# Patient Record
Sex: Female | Born: 1980 | Race: White | Hispanic: No | Marital: Married | Smoking: Current every day smoker
Health system: Southern US, Community
[De-identification: ages and names within clinical notes are randomized; demographics above are authoritative.]

## PROBLEM LIST (undated history)

## (undated) HISTORY — PX: KNEE SURGERY: SHX244

---
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
## 2001-06-27 ENCOUNTER — Other Ambulatory Visit: Admission: RE | Admit: 2001-06-27 | Discharge: 2001-06-27 | Payer: Self-pay | Admitting: Obstetrics and Gynecology

## 2002-03-24 ENCOUNTER — Other Ambulatory Visit (HOSPITAL_COMMUNITY): Admission: RE | Admit: 2002-03-24 | Discharge: 2002-04-03 | Payer: Self-pay | Admitting: Psychiatry

## 2002-08-03 ENCOUNTER — Other Ambulatory Visit: Admission: RE | Admit: 2002-08-03 | Discharge: 2002-08-03 | Payer: Self-pay | Admitting: Obstetrics & Gynecology

## 2002-10-22 ENCOUNTER — Emergency Department (HOSPITAL_COMMUNITY): Admission: EM | Admit: 2002-10-22 | Discharge: 2002-10-22 | Payer: Self-pay | Admitting: *Deleted

## 2003-10-16 ENCOUNTER — Other Ambulatory Visit: Admission: RE | Admit: 2003-10-16 | Discharge: 2003-10-16 | Payer: Self-pay | Admitting: Obstetrics & Gynecology

## 2004-04-12 ENCOUNTER — Emergency Department (HOSPITAL_COMMUNITY): Admission: EM | Admit: 2004-04-12 | Discharge: 2004-04-13 | Payer: Self-pay | Admitting: Emergency Medicine

## 2013-05-25 ENCOUNTER — Encounter (HOSPITAL_COMMUNITY): Payer: Self-pay | Admitting: Emergency Medicine

## 2013-05-25 ENCOUNTER — Emergency Department (HOSPITAL_COMMUNITY)
Admission: EM | Admit: 2013-05-25 | Discharge: 2013-05-25 | Disposition: A | Discharge status: Home / Self Care | Payer: 59 | Source: Home / Self Care | Attending: Emergency Medicine | Admitting: Emergency Medicine

## 2013-05-25 DIAGNOSIS — L089 Local infection of the skin and subcutaneous tissue, unspecified: Secondary | ICD-10-CM

## 2013-05-25 DIAGNOSIS — L723 Sebaceous cyst: Secondary | ICD-10-CM

## 2013-05-25 MED ORDER — HYDROCODONE-ACETAMINOPHEN 5-325 MG PO TABS: ORAL_TABLET | ORAL | Status: DC

## 2013-05-25 MED ORDER — SULFAMETHOXAZOLE-TMP DS 800-160 MG PO TABS: 2.0000 | ORAL_TABLET | Freq: Two times a day (BID) | ORAL | Status: DC

## 2013-05-25 NOTE — ED Notes (Signed)
Written Rx's (Norco 5/325 and Septra DS)given to pt... Epic is down... Provider not able to document in system Scanned.

## 2013-05-25 NOTE — ED Notes (Signed)
Pt c/o abscess on sternum onset 1 week Site is tender and has a localized fever Denies drainage, fevers Alert w/no signs of acute distress.

## 2013-05-25 NOTE — ED Provider Notes (Signed)
Chief Complaint:   Chief Complaint  Patient presents with  . Abscess    History of Present Illness:    Dawn Savage is a 32 year old female who has a cyst on her mid, lower chest, as been present for about a week and over the past 2 days has become inflamed and painful. She denies any fever or drainage. No prior history of sebaceous cysts, abscesses, skin infections, or MRSA.  Review of Systems:  Other than noted above, the patient denies any of the following symptoms: Systemic:  No fever, chills or sweats. Skin:  No rash or itching.  PMFSH:  Past medical history, family history, social history, meds, and allergies were reviewed.  No history of diabetes or prior history of abscesses or MRSA.  Physical Exam:   Vital signs:  BP 118/75  Pulse 93  Temp(Src) 98.3 F (36.8 C) (Oral)  Resp 16  SpO2 95%  LMP 05/02/2013 Skin:  There is a raised, red, tender abscess in the mid lower chest measuring 2.5 cm in diameter. This was fluctuant, but there was no drainage.  Skin exam was otherwise normal.  No rash. Ext:  Distal pulses were full, patient has full ROM of all joints.  Procedure:  Verbal informed consent was obtained.  The patient was informed of the risks and benefits of the procedure and understands and accepts.  Identity of the patient was verified verbally and by wristband.   The abscess area described above was prepped with Betadine and alcohol and anesthetized with 3 mL of 2% Xylocaine with epinephrine.  Using a #11 scalpel blade, a singe straight incision was made into the area of fluctulence, yielding a large amount of prurulent drainage and squamous debris. The sebaceous cyst was completely evacuated of all squamous debris and cyst contents, and as much of the cyst wall as possible was removed, although I'm not sure it was entirely removed.  Routine cultures were obtained.  Blunt dissection was used to break up loculations and the resulting wound cavity was packed with 1/4 inch Iodoform  gauze.  A sterile pressure dressing was applied.  Assessment:  The encounter diagnosis was Infected sebaceous cyst of skin.  Plan:   1.  Meds:  The following meds were prescribed:   Discharge Medication List as of 05/25/2013  7:10 PM    START taking these medications   Details  HYDROcodone-acetaminophen (NORCO/VICODIN) 5-325 MG per tablet 1 to 2 tabs every 4 to 6 hours as needed for pain., Print    sulfamethoxazole-trimethoprim (BACTRIM DS) 800-160 MG per tablet Take 2 tablets by mouth 2 (two) times daily., Starting 05/25/2013, Until Discontinued, Normal        2.  Patient Education/Counseling:  The patient was given appropriate handouts, self care instructions, and instructed in symptomatic relief.  If the cyst recurs suggested she consult a surgeon to have it surgically excised.  3.  Follow up:  The patient was instructed to leave the dressing in place and return here again in 48 hours for packing removal, or sooner if becoming worse in any way, and given some red flag symptoms such as fever or which would prompt immediate return.      Reuben Likes, MD 05/25/13 2123

## 2013-05-27 ENCOUNTER — Encounter (HOSPITAL_COMMUNITY): Payer: Self-pay | Admitting: Emergency Medicine

## 2013-05-27 ENCOUNTER — Emergency Department (INDEPENDENT_AMBULATORY_CARE_PROVIDER_SITE_OTHER)
Admission: EM | Admit: 2013-05-27 | Discharge: 2013-05-27 | Disposition: A | Discharge status: Home / Self Care | Payer: 59 | Source: Home / Self Care | Attending: Family Medicine | Admitting: Family Medicine

## 2013-05-27 DIAGNOSIS — Z5189 Encounter for other specified aftercare: Secondary | ICD-10-CM

## 2013-05-27 DIAGNOSIS — Z4800 Encounter for change or removal of nonsurgical wound dressing: Secondary | ICD-10-CM

## 2013-05-27 NOTE — ED Provider Notes (Addendum)
CSN: [id number]     Arrival date & time 05/27/13  1310 History   None    Chief Complaint  Patient presents with  . Follow-up   (Consider location/radiation/quality/duration/timing/severity/associated sxs/prior Treatment) Patient is a 32 y.o. female presenting with wound check. The history is provided by the patient.  Wound Check This is a new problem. The current episode started 2 days ago. The problem has been gradually improving. Associated symptoms comments: Sl nausea from meds..    History reviewed. No pertinent past medical history. History reviewed. No pertinent past surgical history. No family history on file. History  Substance Use Topics  . Smoking status: Never Smoker   . Smokeless tobacco: Not on file  . Alcohol Use: Yes   OB History   Grav Para Term Preterm Abortions TAB SAB Ect Mult Living                 Review of Systems  Constitutional: Negative.   Gastrointestinal: Positive for nausea.  Skin: Positive for wound.    Allergies  Hydrocodone  Home Medications   Current Outpatient Rx  Name  Route  Sig  Dispense  Refill  . HYDROcodone-acetaminophen (NORCO/VICODIN) 5-325 MG per tablet      1 to 2 tabs every 4 to 6 hours as needed for pain.   20 tablet   0   . sulfamethoxazole-trimethoprim (BACTRIM DS) 800-160 MG per tablet   Oral   Take 2 tablets by mouth 2 (two) times daily.   40 tablet   0    BP 127/74  Pulse 82  Temp(Src) 98.1 F (36.7 C) (Oral)  Resp 16  SpO2 100%  LMP 05/02/2013 Physical Exam  Nursing note and vitals reviewed. Constitutional: She is oriented to person, place, and time. She appears well-developed and well-nourished. No distress.  Abdominal: Soft. Bowel sounds are normal.  Neurological: She is alert and oriented to person, place, and time.  Skin: Skin is warm and dry.  Packing removed, no infection or drainage.    ED Course  Procedures (including critical care time) Labs Review Labs Reviewed - No data to  display Imaging Review No results found.  EKG Interpretation    Date/Time:    Ventricular Rate:    PR Interval:    QRS Duration:   QT Interval:    QTC Calculation:   R Axis:     Text Interpretation:              MDM      Linna Hoff, MD 05/27/13 1427  Linna Hoff, MD 05/30/13 2032

## 2013-05-27 NOTE — ED Notes (Signed)
Pt is here for a f/u and to have packing removed Voices no new concerns.... Alert w/no signs of acute distress.

## 2013-05-29 LAB — CULTURE, ROUTINE-ABSCESS: Special Requests: NORMAL

## 2015-11-01 ENCOUNTER — Emergency Department (HOSPITAL_COMMUNITY)
Admission: EM | Admit: 2015-11-01 | Discharge: 2015-11-01 | Disposition: A | Discharge status: Home / Self Care | Payer: BLUE CROSS/BLUE SHIELD | Attending: Emergency Medicine | Admitting: Emergency Medicine

## 2015-11-01 ENCOUNTER — Encounter (HOSPITAL_COMMUNITY): Payer: Self-pay | Admitting: Emergency Medicine

## 2015-11-01 DIAGNOSIS — Z792 Long term (current) use of antibiotics: Secondary | ICD-10-CM | POA: Insufficient documentation

## 2015-11-01 DIAGNOSIS — M25531 Pain in right wrist: Secondary | ICD-10-CM | POA: Insufficient documentation

## 2015-11-01 DIAGNOSIS — R200 Anesthesia of skin: Secondary | ICD-10-CM | POA: Diagnosis not present

## 2015-11-01 MED ORDER — METHOCARBAMOL 500 MG PO TABS: 500.0000 mg | ORAL_TABLET | Freq: Two times a day (BID) | ORAL | Status: DC

## 2015-11-01 MED ORDER — IBUPROFEN 800 MG PO TABS: 800.0000 mg | ORAL_TABLET | Freq: Three times a day (TID) | ORAL | Status: DC

## 2015-11-01 NOTE — ED Provider Notes (Signed)
CSN: [id number]     Arrival date & time 11/01/15  2235 History  By signing my name below, I, [facility], attest that this documentation has been prepared under the direction and in the presence of Fayrene Helper, PA-C. Electronically Signed: Randell Patient, ED Scribe. 11/01/2015. 11:34 PM.   Chief Complaint  Patient presents with  . Wrist Pain   The history is provided by the patient. No language interpreter was used.   HPI Comments: Dawn Savage is a 35 y.o. female who presents to the Emergency Department complaining of constant, sharp, gradually improving right wrist pain onset earlier today when she was at rest. 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 She reports mild, partial numbness in her right hand in the third, fourth and fifth fingers and mild right hand swelling. Pain is worse with movement and unchanged with palpation. She has taken ibuprofen and applied a Deep Blue topical cream with slight relief of her pain. She is right hand dominant. Denies right shoulder pain, right elbow pain, fever.  History reviewed. No pertinent past medical history. Past Surgical History  Procedure Laterality Date  . Knee surgery     No family history on file. Social History  Substance Use Topics  . Smoking status: Never Smoker   . Smokeless tobacco: None  . Alcohol Use: Yes   OB History    No data available     Review of Systems  Constitutional: Negative for fever.  Musculoskeletal: Positive for joint swelling and arthralgias. Negative for myalgias.  Neurological: Positive for numbness.   Allergies  Hydrocodone  Home Medications   Prior to Admission medications   Medication Sig Start Date End Date Taking? Authorizing Provider  HYDROcodone-acetaminophen (NORCO/VICODIN) 5-325 MG per tablet 1 to 2 tabs every 4 to 6 hours as needed for pain. 05/25/13   Reuben Likes, MD  ibuprofen  (ADVIL,MOTRIN) 800 MG tablet Take 1 tablet (800 mg total) by mouth 3 (three) times daily. 11/01/15   Fayrene Helper, PA-C  methocarbamol (ROBAXIN) 500 MG tablet Take 1 tablet (500 mg total) by mouth 2 (two) times daily. 11/01/15   Fayrene Helper, PA-C  sulfamethoxazole-trimethoprim (BACTRIM DS) 800-160 MG per tablet Take 2 tablets by mouth 2 (two) times daily. 05/25/13   Reuben Likes, MD   BP 136/88 mmHg  Pulse 88  Temp(Src) 98 F (36.7 C) (Oral)  Resp 18  SpO2 96%  LMP 10/14/2015 (Approximate) Physical Exam  Constitutional: She is oriented to person, place, and time. She appears well-developed and well-nourished. No distress.  HENT:  Head: Normocephalic and atraumatic.  Eyes: Conjunctivae are normal.  Neck: Normal range of motion.  Cardiovascular: Normal rate.   Pulmonary/Chest: Effort normal. No respiratory distress.  Musculoskeletal: Normal range of motion. She exhibits tenderness. She exhibits no edema.  On right wrist: tend noted to ulnar aspects of right with palpation without crepitus and edema. Normal supination, pronation, flexion, and extension of right wrist. 2+ right radial. Normal grip strength. Brisk capillary refill to all fingers. Soft compartment of right arm. Right elbow non-tender with normal flexion and extension.  Neurological: She is alert and oriented to person, place, and time.  Skin: Skin is warm and dry.  Psychiatric: She has a normal mood and affect. Her behavior is normal.  Nursing note and vitals reviewed.   ED Course  Procedures   DIAGNOSTIC STUDIES: Oxygen Saturation is 96% on RA, adequate  by my interpretation.    COORDINATION OF CARE: 11:23 PM pain to ulnar aspect of R wrist, it's minimal.  Suspect tenosynovitis.  Pt has a brace at home.  No signs of infection. Doubt carpal tunnel syndrome.  Pt is NVI, with normal strength.  No compartment syndrome.  Discussed risks and benefits of ordering right wrist x-ray and pt and I agree to not order this imaging at  this time. Advised to continue at home treatment. Will provide pt with referral to hand specialist. Advised pt to follow-up as needed. Discussed treatment plan with pt at bedside and pt agreed to plan.     MDM   Final diagnoses:  Right wrist pain   BP 136/88 mmHg  Pulse 88  Temp(Src) 98 F (36.7 C) (Oral)  Resp 18  SpO2 96%  LMP 10/14/2015 (Approximate)   I personally performed the services described in this documentation, which was scribed in my presence. The recorded information has been reviewed and is accurate.      Fayrene HelperBowie Ronelle Michie, PA-C 11/01/15 2342  Layla MawKristen N Ward, DO 11/02/15 0030

## 2015-11-01 NOTE — Discharge Instructions (Signed)
Tendinitis Tendinitis is swelling and inflammation of the tendons. Tendons are band-like tissues that connect muscle to bone. Tendinitis commonly occurs in the:   Shoulders (rotator cuff).  Heels (Achilles tendon).  Elbows (triceps tendon).  Wrist CAUSES Tendinitis is usually caused by overusing the tendon, muscles, and joints involved. When the tissue surrounding a tendon (synovium) becomes inflamed, it is called tenosynovitis. Tendinitis commonly develops in people whose jobs require repetitive motions. SYMPTOMS  Pain.  Tenderness.  Mild swelling. DIAGNOSIS Tendinitis is usually diagnosed by physical exam. Your health care provider may also order X-rays or other imaging tests. TREATMENT Your health care provider may recommend certain medicines or exercises for your treatment. HOME CARE INSTRUCTIONS   Use a sling or splint for as long as directed by your health care provider until the pain decreases.  Put ice on the injured area.  Put ice in a plastic bag.  Place a towel between your skin and the bag.  Leave the ice on for 15-20 minutes, 3-4 times a day, or as directed by your health care provider.  Avoid using the limb while the tendon is painful. Perform gentle range of motion exercises only as directed by your health care provider. Stop exercises if pain or discomfort increase, unless directed otherwise by your health care provider.  Only take over-the-counter or prescription medicines for pain, discomfort, or fever as directed by your health care provider. SEEK MEDICAL CARE IF:   Your pain and swelling increase.  You develop new, unexplained symptoms, especially increased numbness in the hands. MAKE SURE YOU:   Understand these instructions.  Will watch your condition.  Will get help right away if you are not doing well or get worse.   This information is not intended to replace advice given to you by your health care provider. Make sure you discuss any  questions you have with your health care provider.   Document Released: 05/29/2000 Document Revised: 06/22/2014 Document Reviewed: 08/18/2010 Elsevier Interactive Patient Education [employer]2016 [employer] Inc.

## 2015-11-01 NOTE — ED Notes (Signed)
Patient able to ambulate independently  

## 2015-11-01 NOTE — ED Notes (Signed)
Pt. 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

## 2015-11-06 DIAGNOSIS — M24131 Other articular cartilage disorders, right wrist: Secondary | ICD-10-CM | POA: Diagnosis not present

## 2015-11-06 DIAGNOSIS — M778 Other enthesopathies, not elsewhere classified: Secondary | ICD-10-CM | POA: Diagnosis not present

## 2015-11-06 DIAGNOSIS — M25531 Pain in right wrist: Secondary | ICD-10-CM | POA: Diagnosis not present

## 2015-11-29 DIAGNOSIS — M24131 Other articular cartilage disorders, right wrist: Secondary | ICD-10-CM | POA: Diagnosis not present

## 2015-11-29 DIAGNOSIS — M25531 Pain in right wrist: Secondary | ICD-10-CM | POA: Diagnosis not present

## 2015-11-29 DIAGNOSIS — M778 Other enthesopathies, not elsewhere classified: Secondary | ICD-10-CM | POA: Diagnosis not present

## 2017-01-13 ENCOUNTER — Ambulatory Visit (HOSPITAL_COMMUNITY)
Admission: EM | Admit: 2017-01-13 | Discharge: 2017-01-13 | Disposition: A | Discharge status: Home / Self Care | Payer: BLUE CROSS/BLUE SHIELD | Attending: Family Medicine | Admitting: Family Medicine

## 2017-01-13 ENCOUNTER — Encounter (HOSPITAL_COMMUNITY): Payer: Self-pay | Admitting: Emergency Medicine

## 2017-01-13 DIAGNOSIS — H6982 Other specified disorders of Eustachian tube, left ear: Secondary | ICD-10-CM

## 2017-01-13 DIAGNOSIS — H6593 Unspecified nonsuppurative otitis media, bilateral: Secondary | ICD-10-CM | POA: Diagnosis not present

## 2017-01-13 MED ORDER — FLUTICASONE PROPIONATE 50 MCG/ACT NA SUSP: 2.0000 | Freq: Every day | NASAL | 0 refills | Status: DC

## 2017-01-13 MED ORDER — PREDNISONE 20 MG PO TABS: 40.0000 mg | ORAL_TABLET | Freq: Every day | ORAL | 0 refills | Status: AC

## 2017-01-13 NOTE — ED Triage Notes (Signed)
PT reports left ear pain that started [place].

## 2017-01-13 NOTE — Discharge Instructions (Signed)
No signs of bacterial infection today. The year pain could be due to fluid buildup behind the ears, as well as trouble draining fluid due to nasal congestion. Start prednisone and Flonase. Continue over-the-counter decongestions for nasal congestion. Monitor for worsening of symptoms, fever, increased warmth, increased redness, to follow-up for reevaluation.

## 2017-01-13 NOTE — ED Provider Notes (Signed)
CSN: [id number]     Arrival date & time 01/13/17  1642 History   None    Chief Complaint  Patient presents with  . Otalgia   (Consider location/radiation/quality/duration/timing/severity/associated sxs/prior Treatment) 36 year old female comes in with 5 day history of left ear pain. [place] the pain first started. Has a sensation that she needs to "pop her ear" but not able to. She's been taking over-the-counter decongestant without relief. Mild cough with nasal congestion, otherwise no fever, sore throat, trouble breathing, trouble swallowing. Denies abdominal pain, nausea, vomiting, diarrhea. Denies seasonal allergies.      History reviewed. No pertinent past medical history. Past Surgical History:  Procedure Laterality Date  . KNEE SURGERY     No family history on file. Social History  Substance Use Topics  . Smoking status: Current Every Day Smoker    Packs/day: 0.50    Types: Cigarettes  . Smokeless tobacco: Never Used  . Alcohol use No   OB History    No data available     Review of Systems  Reason unable to perform ROS: See HPI as above.    Allergies  Hydrocodone  Home Medications   Prior to Admission medications   Medication Sig Start Date End Date Taking? Authorizing Provider  fluticasone (FLONASE) 50 MCG/ACT nasal spray Place 2 sprays into both nostrils daily. 01/13/17   Cathie HoopsYu, Deepika Decatur V, PA-C  predniSONE (DELTASONE) 20 MG tablet Take 2 tablets (40 mg total) by mouth daily. 01/13/17 01/17/17  Belinda FisherYu, Jadore Veals V, PA-C   Meds Ordered and Administered this Visit  Medications - No data to display  BP 111/61   Pulse 87   Temp 98.9 F (37.2 C) (Oral)   Resp 16   Ht 5\' 3"  (1.6 m)   Wt 200 lb (90.7 kg)   LMP 12/23/2016   SpO2 98%   BMI 35.43 kg/m  No data found.   Physical Exam  Constitutional: She is oriented to person, place, and time. She appears well-developed and well-nourished. No distress.  HENT:  Head: Normocephalic and atraumatic.   Right Ear: External ear and ear canal normal. Tympanic membrane is not erythematous and not bulging. A middle ear effusion is present.  Left Ear: External ear and ear canal normal. Tympanic membrane is not erythematous and not bulging. A middle ear effusion is present.  Nose: Nose normal. Right sinus exhibits no maxillary sinus tenderness and no frontal sinus tenderness. Left sinus exhibits no maxillary sinus tenderness and no frontal sinus tenderness.  Mouth/Throat: Uvula is midline, oropharynx is clear and moist and mucous membranes are normal.  Tenderness on palpation around the post auricular area. No tenderness on palpation of the tragus. No swelling or erythema of the ear canal.  Eyes: Pupils are equal, round, and reactive to light. Conjunctivae are normal.  Neck: Normal range of motion. Neck supple.  Cardiovascular: Normal rate, regular rhythm and normal heart sounds.  Exam reveals no gallop and no friction rub.   No murmur heard. Pulmonary/Chest: Effort normal and breath sounds normal. She has no decreased breath sounds. She has no wheezes. She has no rhonchi. She has no rales.  Lymphadenopathy:    She has no cervical adenopathy.  Neurological: She is alert and oriented to person, place, and time.  Skin: Skin is warm and dry.  Psychiatric: She has a normal mood and affect. Her behavior is normal. Judgment normal.    Urgent Care Course     Procedures (including critical  care time)  Labs Review Labs Reviewed - No data to display  Imaging Review No results found.     MDM   1. Dysfunction of left eustachian tube   2. Fluid level behind tympanic membrane of both ears    Discussed with patient history and exam most consistent with mid ear effusion and eustachian tube dysfunction. Start prednisone and Flonase as directed. Continue over-the-counter decongestant. Discussed with patient tenderness to palpation behind the ears could be due to lymph nodes with the beginning symptoms  of a viral illness. Patient to monitor for worsening of symptoms, fever, increased pain, surrounding erythema and increased warmth, to follow-up for reevaluation.   Belinda FisherYu, Elisabet Gutzmer V, PA-C 01/13/17 1751

## 2017-03-25 DIAGNOSIS — S46002A Unspecified injury of muscle(s) and tendon(s) of the rotator cuff of left shoulder, initial encounter: Secondary | ICD-10-CM | POA: Diagnosis not present

## 2017-03-25 DIAGNOSIS — M7552 Bursitis of left shoulder: Secondary | ICD-10-CM | POA: Diagnosis not present

## 2017-10-26 DIAGNOSIS — L03039 Cellulitis of unspecified toe: Secondary | ICD-10-CM | POA: Diagnosis not present

## 2017-11-11 DIAGNOSIS — L03039 Cellulitis of unspecified toe: Secondary | ICD-10-CM | POA: Diagnosis not present

## 2017-11-16 DIAGNOSIS — M79644 Pain in right finger(s): Secondary | ICD-10-CM | POA: Diagnosis not present

## 2017-11-16 DIAGNOSIS — L03011 Cellulitis of right finger: Secondary | ICD-10-CM | POA: Diagnosis not present

## 2017-11-19 DIAGNOSIS — L03011 Cellulitis of right finger: Secondary | ICD-10-CM | POA: Diagnosis not present

## 2017-11-19 DIAGNOSIS — M79644 Pain in right finger(s): Secondary | ICD-10-CM | POA: Diagnosis not present

## 2018-03-04 DIAGNOSIS — L720 Epidermal cyst: Secondary | ICD-10-CM | POA: Diagnosis not present

## 2018-03-17 DIAGNOSIS — L720 Epidermal cyst: Secondary | ICD-10-CM | POA: Diagnosis not present

## 2018-06-17 ENCOUNTER — Ambulatory Visit
Admission: EM | Admit: 2018-06-17 | Discharge: 2018-06-17 | Disposition: A | Discharge status: Home / Self Care | Payer: BLUE CROSS/BLUE SHIELD | Attending: Emergency Medicine | Admitting: Emergency Medicine

## 2018-06-17 DIAGNOSIS — B9789 Other viral agents as the cause of diseases classified elsewhere: Secondary | ICD-10-CM | POA: Insufficient documentation

## 2018-06-17 DIAGNOSIS — J069 Acute upper respiratory infection, unspecified: Secondary | ICD-10-CM | POA: Insufficient documentation

## 2018-06-17 MED ORDER — FLUTICASONE PROPIONATE 50 MCG/ACT NA SUSP: 2.0000 | Freq: Every day | NASAL | 0 refills | Status: DC

## 2018-06-17 MED ORDER — BENZONATATE 100 MG PO CAPS: 100.0000 mg | ORAL_CAPSULE | Freq: Three times a day (TID) | ORAL | 0 refills | Status: DC

## 2018-06-17 MED ORDER — CETIRIZINE-PSEUDOEPHEDRINE ER 5-120 MG PO TB12: 1.0000 | ORAL_TABLET | Freq: Every day | ORAL | 0 refills | Status: DC

## 2018-06-17 NOTE — ED Triage Notes (Signed)
Pt c/o head and chest congestion with cough x3days

## 2018-06-17 NOTE — ED Provider Notes (Signed)
[facility]   [id number] 06/17/18 Arrival Time: 1621   CC: URI symptoms   SUBJECTIVE: History from: patient.  Dawn Savage is a 38 y.o. female who presents with abrupt onset of nasal congestion, runny nose, sore throat, and productive cough with brown/ green sputum x 3 days.  Admits to positive sick exposure to flu.  Has tried OTC medications without relief.  Symptoms are made worse with at night.  Reports previous symptoms in the past.  Complains of associated fatigue.   Denies fever, chills, SOB, wheezing, chest pain, nausea, changes in bowel or bladder habits.     Received flu shot this year: no.  ROS: As per HPI.  History reviewed. No pertinent past medical history. Past Surgical History:  Procedure Laterality Date  . KNEE SURGERY     Allergies  Allergen Reactions  . Hydrocodone    No current facility-administered medications on file prior to encounter.    No current outpatient medications on file prior to encounter.   Social History   Socioeconomic History  . Marital status: Married    Spouse name: Not on file  . Number of children: Not on file  . Years of education: Not on file  . Highest education level: Not on file  Occupational History  . Not on file  Social Needs  . Financial resource strain: Not on file  . Food insecurity:    Worry: Not on file    Inability: Not on file  . Transportation needs:    Medical: Not on file    Non-medical: Not on file  Tobacco Use  . Smoking status: Current Every Day Smoker    Packs/day: 0.50    Types: Cigarettes  . Smokeless tobacco: Never Used  Substance and Sexual Activity  . Alcohol use: No  . Drug use: No  . Sexual activity: Not on file  Lifestyle  . Physical activity:    Days per week: Not on file    Minutes per session: Not on file  . Stress: Not on file  Relationships  . Social connections:    Talks on phone: Not on file    Gets together: Not on file    Attends religious service: Not on file    Active member of club or organization: Not on file    Attends meetings of clubs or organizations: Not on file    Relationship status: Not on file  . Intimate partner violence:    Fear of current or ex partner: Not on file    Emotionally abused: Not on file    Physically abused: Not on file    Forced sexual activity: Not on file  Other Topics Concern  . Not on file  Social History Narrative  . Not on file   No family history on file.  OBJECTIVE:  Vitals:   06/17/18 1630  BP: 117/81  Pulse: 96  Resp: 18  Temp: 98 F (36.7 C)  TempSrc: Oral  SpO2: 96%     General appearance: alert; appears mildly fatigued, but nontoxic; speaking in full sentences and tolerating own secretions HEENT: NCAT; Ears: EACs clear, TMs pearly gray; Eyes: PERRL.  EOM grossly intact. Nose: nares patent with mild rhinorrhea, turbinates swollen and erythematous Throat: oropharynx clear, tonsils non erythematous or enlarged, uvula midline  Neck: supple without LAD Lungs: unlabored respirations, symmetrical air entry; cough: mild; no respiratory distress; CTAB Heart: regular rate and rhythm.  Radial pulses 2+ symmetrical bilaterally Skin: warm and dry Psychological: alert and cooperative;  normal mood and affect  ASSESSMENT & PLAN:  1. Viral URI with cough     Meds ordered this encounter  Medications  . fluticasone (FLONASE) 50 MCG/ACT nasal spray    Sig: Place 2 sprays into both nostrils daily.    Dispense:  16 g    Refill:  0    Order Specific Question:   Supervising Provider    Answer:   Eustace Moore [[id number]]  . cetirizine-pseudoephedrine (ZYRTEC-D) 5-120 MG tablet    Sig: Take 1 tablet by mouth daily.    Dispense:  30 tablet    Refill:  0    Order Specific Question:   Supervising Provider    Answer:   Eustace Moore [[id number]]  . benzonatate (TESSALON) 100 MG capsule    Sig: Take 1 capsule (100 mg total) by mouth every 8 (eight) hours.    Dispense:  21 capsule    Refill:  0     Order Specific Question:   Supervising Provider    Answer:   Eustace Moore [[id number]]    Get plenty of rest and push fluids Tessalon Perles prescribed for cough Zyrtec-D prescribed for nasal congestion, runny nose, and/or sore throat Flonase prescribed for nasal congestion and runny nose Use medications daily for symptom relief Use OTC medications like ibuprofen or tylenol as needed fever or pain Follow up with PCP if symptoms persist Return or go to ER if you have any new or worsening symptoms fever, chills, nausea, vomiting, chest pain, cough, shortness of breath, wheezing, abdominal pain, changes in bowel or bladder habits, etc...  Reviewed expectations re: course of current medical issues. Questions answered. Outlined signs and symptoms indicating need for more acute intervention. Patient verbalized understanding. After Visit Summary given.         Rennis Harding, PA-C 06/17/18 1649

## 2018-06-17 NOTE — Discharge Instructions (Addendum)
Get plenty of rest and push fluids °Tessalon Perles prescribed for cough °Zyrtec-D prescribed for nasal congestion, runny nose, and/or sore throat °Flonase prescribed for nasal congestion and runny nose °Use medications daily for symptom relief °Use OTC medications like ibuprofen or tylenol as needed fever or pain °Follow up with PCP if symptoms persist °Return or go to ER if you have any new or worsening symptoms fever, chills, nausea, vomiting, chest pain, cough, shortness of breath, wheezing, abdominal pain, changes in bowel or bladder habits, etc... °

## 2018-10-24 ENCOUNTER — Ambulatory Visit (INDEPENDENT_AMBULATORY_CARE_PROVIDER_SITE_OTHER): Payer: BLUE CROSS/BLUE SHIELD

## 2018-10-24 ENCOUNTER — Other Ambulatory Visit: Payer: Self-pay

## 2018-10-24 ENCOUNTER — Encounter: Payer: Self-pay | Admitting: Emergency Medicine

## 2018-10-24 ENCOUNTER — Ambulatory Visit
Admission: EM | Admit: 2018-10-24 | Discharge: 2018-10-24 | Disposition: A | Discharge status: Home / Self Care | Payer: BLUE CROSS/BLUE SHIELD | Attending: Physician Assistant | Admitting: Physician Assistant

## 2018-10-24 DIAGNOSIS — S6991XA Unspecified injury of right wrist, hand and finger(s), initial encounter: Secondary | ICD-10-CM | POA: Diagnosis not present

## 2018-10-24 DIAGNOSIS — W208XXA Other cause of strike by thrown, projected or falling object, initial encounter: Secondary | ICD-10-CM | POA: Diagnosis not present

## 2018-10-24 DIAGNOSIS — M79641 Pain in right hand: Secondary | ICD-10-CM | POA: Diagnosis not present

## 2018-10-24 DIAGNOSIS — S6721XA Crushing injury of right hand, initial encounter: Secondary | ICD-10-CM

## 2018-10-24 NOTE — Discharge Instructions (Signed)
X-ray negative for fracture or dislocation.  Continue antibiotic ointment for the next 2 to 3 days to prevent infection.  Use soap and water gently to clean the wounds, keep clean and dry.  Do not soak in water.  Ice compress to prevent worsening swelling.  Finger splint during activity.  Monitor for spreading redness, increased warmth, fever, follow-up for reevaluation.  If still having trouble moving fingers despite pain resolving, numbness/tingling, follow-up with hand orthopedics for further evaluation and management needed.

## 2018-10-24 NOTE — ED Triage Notes (Signed)
Pt presents to [facility] for assessment of right hand pain after sustaining an injury (patient not really sure how it happened) to her right hand.  Patient applied anitbiotic ointment after cleaning the area PTA.  Pt unsure of last tetanus.

## 2018-10-24 NOTE — ED Notes (Signed)
Patient able to ambulate independently  

## 2018-10-24 NOTE — ED Provider Notes (Signed)
[facility]    CSN: [id number] Arrival date & time: 10/24/18  1342     History   Chief Complaint Chief Complaint  Patient presents with  . Hand Pain    HPI Dawn Savage is a 38 y.o. female.   38 year old female right hand pain and injury that occurred a few hours prior to arrival. States was pulling a log when it rolled onto her right hand.  She has abrasions along the dorsal aspect of the hand with swelling and contusion to the second and third finger.  Denies numbness, tingling.  Mild decrease in range of motion.  Cleaned area with water and hydrogen peroxide, applied antibiotic ointment, and came in for evaluation. Unknown last tetanus.      History reviewed. No pertinent past medical history.  There are no active problems to display for this patient.   Past Surgical History:  Procedure Laterality Date  . KNEE SURGERY      OB History   No obstetric history on file.      Home Medications    Prior to Admission medications   Medication Sig Start Date End Date Taking? Authorizing Provider  cetirizine-pseudoephedrine (ZYRTEC-D) 5-120 MG tablet Take 1 tablet by mouth daily. 06/17/18   Wurst, [place], PA-C  fluticasone (FLONASE) 50 MCG/ACT nasal spray Place 2 sprays into both nostrils daily. 06/17/18   Rennis HardingWurst, Brittany, PA-C    Family History History reviewed. No pertinent family history.  Social History Social History   Tobacco Use  . Smoking status: Current Every Day Smoker    Packs/day: 0.50    Types: Cigarettes  . Smokeless tobacco: Never Used  Substance Use Topics  . Alcohol use: No  . Drug use: No     Allergies   Hydrocodone   Review of Systems Review of Systems  Reason unable to perform ROS: See HPI as above.     Physical Exam Triage Vital Signs ED Triage Vitals [10/24/18 1354]  Enc Vitals Group     BP 127/85     Pulse Rate 86     Resp 16     Temp 98.3 F (36.8 C)     Temp Source Oral     SpO2 95 %     Weight    Height      Head Circumference      Peak Flow      Pain Score 5     Pain Loc      Pain Edu?      Excl. in GC?    No data found.  Updated Vital Signs BP 127/85 (BP Location: Left Arm)   Pulse 86   Temp 98.3 F (36.8 C) (Oral)   Resp 16   SpO2 95%   Physical Exam Constitutional:      General: She is not in acute distress.    Appearance: She is well-developed. She is not diaphoretic.  HENT:     Head: Normocephalic and atraumatic.  Eyes:     Conjunctiva/sclera: Conjunctivae normal.     Pupils: Pupils are equal, round, and reactive to light.  Musculoskeletal:     Comments: Multiple abrasions along dorsal right hand. Bleeding controlled. Swelling with contusion to the PIP of 2nd and 3rd finger. Tenderness to palpation along 2nd and 3rd MCP/finger. Decreased flexion of 2nd and 3rd digits. Full ROM of wrist. Strength deferred. Sensation intact and equal bilaterally. Radial pulse 2+, cap refill <2s  Neurological:     Mental Status: She is  alert and oriented to person, place, and time.     UC Treatments / Results  Labs (all labs ordered are listed, but only abnormal results are displayed) Labs Reviewed - No data to display  EKG None  Radiology Dg Hand Complete Right  Result Date: 10/24/2018 CLINICAL DATA:  Lifting injury with pain. EXAM: RIGHT HAND - COMPLETE 3+ VIEW COMPARISON:  None. FINDINGS: There is no evidence of fracture or dislocation. There is no evidence of arthropathy or other focal bone abnormality. Soft tissues are unremarkable. IMPRESSION: Negative. Electronically Signed   By: Paulina Fusi M.D.   On: 10/24/2018 14:15    Procedures Procedures (including critical care time)  Medications Ordered in UC Medications - No data to display  Initial Impression / Assessment and Plan / UC Course  I have reviewed the triage vital signs and the nursing notes.  Pertinent labs & imaging results that were available during my care of the patient were reviewed by me and  considered in my medical decision making (see chart for details).    X-ray negative for fracture or dislocation.  Rest compress, elevation, finger splint during activity.  Discussed course of recovery.  Return precautions given.  Patient expresses understanding and agrees to plan.  Final Clinical Impressions(s) / UC Diagnoses   Final diagnoses:  Crushing injury of right hand, initial encounter    ED Prescriptions    None        Belinda Fisher, PA-C 10/24/18 1713

## 2018-11-01 DIAGNOSIS — M25531 Pain in right wrist: Secondary | ICD-10-CM | POA: Diagnosis not present

## 2018-11-10 DIAGNOSIS — M79644 Pain in right finger(s): Secondary | ICD-10-CM | POA: Diagnosis not present

## 2018-11-17 DIAGNOSIS — M79644 Pain in right finger(s): Secondary | ICD-10-CM | POA: Diagnosis not present

## 2018-11-17 DIAGNOSIS — M67831 Other specified disorders of synovium, right wrist: Secondary | ICD-10-CM | POA: Diagnosis not present

## 2018-12-20 DIAGNOSIS — M67831 Other specified disorders of synovium, right wrist: Secondary | ICD-10-CM | POA: Diagnosis not present

## 2019-01-17 DIAGNOSIS — M67831 Other specified disorders of synovium, right wrist: Secondary | ICD-10-CM | POA: Diagnosis not present

## 2020-02-15 ENCOUNTER — Ambulatory Visit
Admission: EM | Admit: 2020-02-15 | Discharge: 2020-02-15 | Disposition: A | Discharge status: Home / Self Care | Payer: BC Managed Care – PPO | Attending: Emergency Medicine | Admitting: Emergency Medicine

## 2020-02-15 ENCOUNTER — Other Ambulatory Visit: Payer: Self-pay

## 2020-02-15 DIAGNOSIS — N3001 Acute cystitis with hematuria: Secondary | ICD-10-CM

## 2020-02-15 LAB — POCT URINALYSIS DIP (MANUAL ENTRY)
Bilirubin, UA: NEGATIVE
Glucose, UA: NEGATIVE mg/dL
Ketones, POC UA: NEGATIVE mg/dL
Nitrite, UA: POSITIVE — AB
Protein Ur, POC: 30 mg/dL — AB
Spec Grav, UA: 1.02 (ref 1.010–1.025)
Urobilinogen, UA: 0.2 E.U./dL
pH, UA: 6 (ref 5.0–8.0)

## 2020-02-15 LAB — POCT URINE PREGNANCY: Preg Test, Ur: NEGATIVE

## 2020-02-15 MED ORDER — NITROFURANTOIN MONOHYD MACRO 100 MG PO CAPS: 100.0000 mg | ORAL_CAPSULE | Freq: Two times a day (BID) | ORAL | 0 refills | Status: DC

## 2020-02-15 NOTE — ED Provider Notes (Signed)
[facility]    CSN: [id number] Arrival date & time: 02/15/20  0853      History   Chief Complaint Chief Complaint  Patient presents with  . Urinary Tract Infection    HPI Dawn Savage is a 39 y.o. female  Subjective:  Dawn Savage is a 39 y.o. female who complains of dysuria, frequency and urgency for a few days.  Patient denies back pain, fever, stomach ache and vaginal discharge.  Patient does not have a history of recurrent UTI.  Patient does not have a history of pyelonephritis. The following portions of the patient's history were reviewed and updated as appropriate: allergies, current medications, past family history, past medical history, past social history, past surgical history and problem list.     History reviewed. No pertinent past medical history.  There are no problems to display for this patient.   Past Surgical History:  Procedure Laterality Date  . KNEE SURGERY      OB History   No obstetric history on file.      Home Medications    Prior to Admission medications   Medication Sig Start Date End Date Taking? Authorizing Provider  nitrofurantoin, macrocrystal-monohydrate, (MACROBID) 100 MG capsule Take 1 capsule (100 mg total) by mouth 2 (two) times daily. 02/15/20   Hall-Potvin, [place], PA-C    Family History History reviewed. No pertinent family history.  Social History Social History   Tobacco Use  . Smoking status: Current Every Day Smoker    Packs/day: 0.50    Types: Cigarettes  . Smokeless tobacco: Never Used  Substance Use Topics  . Alcohol use: No  . Drug use: No     Allergies   Hydrocodone   Review of Systems As per HPI   Physical Exam Triage Vital Signs ED Triage Vitals [02/15/20 1000]  Enc Vitals Group     BP 121/75     Pulse Rate 63     Resp 18     Temp 98.5 F (36.9 C)     Temp Source Oral     SpO2 96 %     Weight      Height      Head Circumference      Peak Flow      Pain Score 5      Pain Loc      Pain Edu?      Excl. in GC?    No data found.  Updated Vital Signs BP 121/75 (BP Location: Left Arm)   Pulse 63   Temp 98.5 F (36.9 C) (Oral)   Resp 18   LMP 02/04/2020   SpO2 96%   Visual Acuity Right Eye Distance:   Left Eye Distance:   Bilateral Distance:    Right Eye Near:   Left Eye Near:    Bilateral Near:     Physical Exam Constitutional:      General: She is not in acute distress. HENT:     Head: Normocephalic and atraumatic.  Eyes:     General: No scleral icterus.    Pupils: Pupils are equal, round, and reactive to light.  Cardiovascular:     Rate and Rhythm: Normal rate.  Pulmonary:     Effort: Pulmonary effort is normal.  Abdominal:     General: Bowel sounds are normal.     Palpations: Abdomen is soft.     Tenderness: There is no abdominal tenderness. There is no right CVA tenderness, left CVA tenderness or guarding.  Genitourinary:  Comments: Patient declined, self-swab performed Skin:    Coloration: Skin is not jaundiced or pale.  Neurological:     Mental Status: She is alert and oriented to person, place, and time.      UC Treatments / Results  Labs (all labs ordered are listed, but only abnormal results are displayed) Labs Reviewed  POCT URINALYSIS DIP (MANUAL ENTRY) - Abnormal; Notable for the following components:      Result Value   Clarity, UA cloudy (*)    Blood, UA large (*)    Protein Ur, POC =30 (*)    Nitrite, UA Positive (*)    Leukocytes, UA Large (3+) (*)    All other components within normal limits  URINE CULTURE  POCT URINE PREGNANCY    EKG   Radiology No results found.  Procedures Procedures (including critical care time)  Medications Ordered in UC Medications - No data to display  Initial Impression / Assessment and Plan / UC Course  I have reviewed the triage vital signs and the nursing notes.  Pertinent labs & imaging results that were available during my care of the patient were  reviewed by me and considered in my medical decision making (see chart for details).     Plan:  1. Medications: nitrofurantoin 2. Maintain adequate hydration 3. Follow up if symptoms not improving, and prn.  Final Clinical Impressions(s) / UC Diagnoses   Final diagnoses:  Acute cystitis with hematuria     Discharge Instructions     Take antibiotic twice daily with food. Important to drink plenty of water throughout the day. May take Azo as needed for burning sensation. Return for worsening urinary symptoms, blood in urine, abdominal or back pain, fever.    ED Prescriptions    Medication Sig Dispense Auth. Provider   nitrofurantoin, macrocrystal-monohydrate, (MACROBID) 100 MG capsule Take 1 capsule (100 mg total) by mouth 2 (two) times daily. 10 capsule Hall-Potvin, [place], PA-C     PDMP not reviewed this encounter.   Hall-Potvin, [place], [place] 02/15/20 1046

## 2020-02-15 NOTE — ED Triage Notes (Signed)
Pt c/o urinary discomfort, urgency, and frequency since Sunday night.

## 2020-02-15 NOTE — Discharge Instructions (Signed)
Take antibiotic twice daily with food. °Important to drink plenty of water throughout the day. °May take Azo as needed for burning sensation. °Return for worsening urinary symptoms, blood in urine, abdominal or back pain, fever. °

## 2020-02-17 LAB — URINE CULTURE: Culture: >=100000 — AB

## 2020-03-09 ENCOUNTER — Encounter: Payer: Self-pay | Admitting: Emergency Medicine

## 2020-03-09 ENCOUNTER — Ambulatory Visit
Admission: EM | Admit: 2020-03-09 | Discharge: 2020-03-09 | Disposition: A | Discharge status: Home / Self Care | Payer: BC Managed Care – PPO | Attending: Family Medicine | Admitting: Family Medicine

## 2020-03-09 ENCOUNTER — Other Ambulatory Visit: Payer: Self-pay

## 2020-03-09 DIAGNOSIS — J069 Acute upper respiratory infection, unspecified: Secondary | ICD-10-CM

## 2020-03-09 DIAGNOSIS — H66002 Acute suppurative otitis media without spontaneous rupture of ear drum, left ear: Secondary | ICD-10-CM

## 2020-03-09 MED ORDER — PREDNISONE 20 MG PO TABS: 20.0000 mg | ORAL_TABLET | Freq: Every day | ORAL | 0 refills | Status: AC

## 2020-03-09 MED ORDER — CEPHALEXIN 500 MG PO CAPS: 500.0000 mg | ORAL_CAPSULE | Freq: Three times a day (TID) | ORAL | 0 refills | Status: AC

## 2020-03-09 NOTE — ED Provider Notes (Signed)
[facility]    CSN: [id number] Arrival date & time: 03/09/20  1224      History   Chief Complaint Chief Complaint  Patient presents with  . Otalgia    HPI Dawn Savage is a 39 y.o. female.   HPI  Otalgia in both ears x 3 days. Endorses some mild congestion. No fever. No recent swimming. No fever. Endorses recurrent sinus infection. Congestion and facial pressure present for over week. She has tried OTC medication for congestion without relief. History reviewed. No pertinent past medical history.  There are no problems to display for this patient.   Past Surgical History:  Procedure Laterality Date  . KNEE SURGERY      OB History   No obstetric history on file.      Home Medications    Prior to Admission medications   Medication Sig Start Date End Date Taking? Authorizing Provider  cephALEXin (KEFLEX) 500 MG capsule Take 1 capsule (500 mg total) by mouth 3 (three) times daily for 10 days. 03/09/20 03/19/20  Bing Neighbors, FNP  nitrofurantoin, macrocrystal-monohydrate, (MACROBID) 100 MG capsule Take 1 capsule (100 mg total) by mouth 2 (two) times daily. Patient not taking: Reported on 03/09/2020 02/15/20   Hall-Potvin, [place], PA-C  predniSONE (DELTASONE) 20 MG tablet Take 1 tablet (20 mg total) by mouth daily with breakfast for 5 days. 03/09/20 03/14/20  Bing Neighbors, FNP    Family History History reviewed. No pertinent family history.  Social History Social History   Tobacco Use  . Smoking status: Current Every Day Smoker    Packs/day: 0.50    Types: Cigarettes  . Smokeless tobacco: Never Used  Substance Use Topics  . Alcohol use: No  . Drug use: No     Allergies   Hydrocodone Review of Systems Review of Systems Pertinent negatives listed in HPI Physical Exam Triage Vital Signs ED Triage Vitals [03/09/20 1615]  Enc Vitals Group     BP 129/84     Pulse Rate 88     Resp 18     Temp 98.7 F (37.1 C)     Temp Source Oral      SpO2 98 %     Weight      Height      Head Circumference      Peak Flow      Pain Score 8     Pain Loc      Pain Edu?      Excl. in GC?    No data found.  Updated Vital Signs BP 129/84 (BP Location: Left Arm)   Pulse 88   Temp 98.7 F (37.1 C) (Oral)   Resp 18   SpO2 98%   Visual Acuity Right Eye Distance:   Left Eye Distance:   Bilateral Distance:    Right Eye Near:   Left Eye Near:    Bilateral Near:     Physical Exam Constitutional:      Appearance: She is ill-appearing.  HENT:     Head: Normocephalic.     Right Ear: Hearing and ear canal normal. A middle ear effusion is present.     Left Ear: Hearing normal. Tenderness present. A middle ear effusion is present. Tympanic membrane is erythematous.     Nose: Mucosal edema, congestion and rhinorrhea present.     Right Turbinates: Enlarged.     Left Turbinates: Enlarged.  Cardiovascular:     Rate and Rhythm: Normal rate and regular rhythm.  Pulmonary:     Effort: Pulmonary effort is normal.     Breath sounds: Normal breath sounds and air entry.  Neurological:     General: No focal deficit present.     Mental Status: She is alert and oriented to person, place, and time.    UC Treatments / Results  Labs (all labs ordered are listed, but only abnormal results are displayed) Labs Reviewed - No data to display  EKG   Radiology No results found.  Procedures Procedures (including critical care time)  Medications Ordered in UC Medications - No data to display  Initial Impression / Assessment and Plan / UC Course  I have reviewed the triage vital signs and the nursing notes.  Pertinent labs & imaging results that were available during my care of the patient were reviewed by me and considered in my medical decision making (see chart for details).     Afebrile. No acute distress. Orders per discharge instructions, if symptoms worsen or do not improve follow-up with PCP.   Final Clinical Impressions(s) /  UC Diagnoses   Final diagnoses:  Non-recurrent acute suppurative otitis media of left ear without spontaneous rupture of tympanic membrane  Upper respiratory tract infection, unspecified type   Discharge Instructions   None    ED Prescriptions    Medication Sig Dispense Auth. Provider   cephALEXin (KEFLEX) 500 MG capsule Take 1 capsule (500 mg total) by mouth 3 (three) times daily for 10 days. 30 capsule Bing Neighbors, FNP   predniSONE (DELTASONE) 20 MG tablet Take 1 tablet (20 mg total) by mouth daily with breakfast for 5 days. 5 tablet Bing Neighbors, FNP     PDMP not reviewed this encounter.   Bing Neighbors, FNP 03/14/20 [phone]

## 2020-03-09 NOTE — ED Triage Notes (Signed)
Pt sts left sided earache x 3 days

## 2020-04-09 ENCOUNTER — Ambulatory Visit
Admission: EM | Admit: 2020-04-09 | Discharge: 2020-04-09 | Disposition: A | Discharge status: Home / Self Care | Payer: BC Managed Care – PPO | Attending: Physician Assistant | Admitting: Physician Assistant

## 2020-04-09 DIAGNOSIS — Z1152 Encounter for screening for COVID-19: Secondary | ICD-10-CM

## 2020-04-09 DIAGNOSIS — J4521 Mild intermittent asthma with (acute) exacerbation: Secondary | ICD-10-CM | POA: Diagnosis not present

## 2020-04-09 DIAGNOSIS — J069 Acute upper respiratory infection, unspecified: Secondary | ICD-10-CM | POA: Diagnosis not present

## 2020-04-09 MED ORDER — ALBUTEROL SULFATE HFA 108 (90 BASE) MCG/ACT IN AERS
4.0000 | INHALATION_SPRAY | Freq: Once | RESPIRATORY_TRACT | Status: AC
  Administered 2020-04-09: 4 via RESPIRATORY_TRACT

## 2020-04-09 MED ORDER — PREDNISONE 50 MG PO TABS: 50.0000 mg | ORAL_TABLET | Freq: Every day | ORAL | 0 refills | Status: AC

## 2020-04-09 NOTE — ED Triage Notes (Signed)
Pt c/o cough, nasal/chest congestion, and sinus drainage since Sunday night after out riding 4-wheelers in the [place] over the weekend.

## 2020-04-09 NOTE — ED Provider Notes (Signed)
[facility]    CSN: [id number] Arrival date & time: 04/09/20  9563      History   Chief Complaint Chief Complaint  Patient presents with  . Nasal Congestion    HPI Dawn Savage is a 39 y.o. female.   39 year old female comes in for 3 day of URI symptoms. Cough, nasal congestion, sinus drainage. Denies fever, chills, body aches. Denies abdominal pain, nausea, vomiting, diarrhea. Shortness of breath due to congestion. Denies shortness of breath, loss of taste/smell. Current every day smoker.      History reviewed. No pertinent past medical history.  There are no problems to display for this patient.   Past Surgical History:  Procedure Laterality Date  . KNEE SURGERY      OB History   No obstetric history on file.      Home Medications    Prior to Admission medications   Medication Sig Start Date End Date Taking? Authorizing Provider  predniSONE (DELTASONE) 50 MG tablet Take 1 tablet (50 mg total) by mouth daily with breakfast. 04/09/20   Belinda Fisher, PA-C    Family History History reviewed. No pertinent family history.  Social History Social History   Tobacco Use  . Smoking status: Current Every Day Smoker    Packs/day: 0.50    Types: Cigarettes  . Smokeless tobacco: Never Used  Substance Use Topics  . Alcohol use: No  . Drug use: No     Allergies   Hydrocodone   Review of Systems Review of Systems  Reason unable to perform ROS: See HPI as above.     Physical Exam Triage Vital Signs ED Triage Vitals [04/09/20 0850]  Enc Vitals Group     BP 127/82     Pulse Rate 93     Resp 18     Temp 98.1 F (36.7 C)     Temp Source Oral     SpO2 92 %     Weight      Height      Head Circumference      Peak Flow      Pain Score 0     Pain Loc      Pain Edu?      Excl. in GC?    No data found.  Updated Vital Signs BP 127/82 (BP Location: Left Arm)   Pulse 93   Temp 98.1 F (36.7 C) (Oral)   Resp 18   LMP 03/26/2020   SpO2  92%   Physical Exam Constitutional:      General: She is not in acute distress.    Appearance: Normal appearance. She is not ill-appearing, toxic-appearing or diaphoretic.  HENT:     Head: Normocephalic and atraumatic.     Mouth/Throat:     Mouth: Mucous membranes are moist.     Pharynx: Oropharynx is clear. Uvula midline.  Cardiovascular:     Rate and Rhythm: Normal rate and regular rhythm.     Heart sounds: Normal heart sounds. No murmur heard.  No friction rub. No gallop.   Pulmonary:     Effort: Pulmonary effort is normal. No accessory muscle usage, prolonged expiration, respiratory distress or retractions.     Comments: Decreased air movement. Inspiratory and expiratory wheezing throughout.  Albuterol 4 puffs x 1: LCTAB Musculoskeletal:     Cervical back: Normal range of motion and neck supple.  Neurological:     General: No focal deficit present.     Mental Status: She  is alert and oriented to person, place, and time.      UC Treatments / Results  Labs (all labs ordered are listed, but only abnormal results are displayed) Labs Reviewed  NOVEL CORONAVIRUS, NAA    EKG   Radiology No results found.  Procedures Procedures (including critical care time)  Medications Ordered in UC Medications  albuterol (VENTOLIN HFA) 108 (90 Base) MCG/ACT inhaler 4 puff (4 puffs Inhalation Given 04/09/20 0912)    Initial Impression / Assessment and Plan / UC Course  I have reviewed the triage vital signs and the nursing notes.  Pertinent labs & imaging results that were available during my care of the patient were reviewed by me and considered in my medical decision making (see chart for details).    COVID PCR test ordered. Patient to quarantine until testing results return. No alarming signs on exam. At first had decrease air movement with diffuse wheezing. LCTAB after albuterol 4 puffs. Will continue albuterol and symptomatic treatment for now. Rx of prednisone given, can  start if needed.  Push fluids.  Return precautions given.  Patient expresses understanding and agrees to plan.  Final Clinical Impressions(s) / UC Diagnoses   Final diagnoses:  Encounter for screening for COVID-19  Viral URI  Mild intermittent reactive airway disease with acute exacerbation   ED Prescriptions    Medication Sig Dispense Auth. Provider   predniSONE (DELTASONE) 50 MG tablet Take 1 tablet (50 mg total) by mouth daily with breakfast. 5 tablet Belinda Fisher, PA-C     PDMP not reviewed this encounter.   Belinda Fisher, PA-C 04/09/20 [phone]

## 2020-04-09 NOTE — Discharge Instructions (Signed)
COVID PCR testing ordered. I would like you to quarantine until testing results. Albuterol 1-2 puffs every 4-6 hours. Add prednisone if increase use of albuterol. You can take over the counter flonase/nasacort to help with nasal congestion/drainage. Tylenol/motrin for pain and fever. Keep hydrated, urine should be clear to pale yellow in color. If experiencing shortness of breath, trouble breathing, go to the emergency department for further evaluation needed.

## 2020-04-10 LAB — SARS-COV-2, NAA 2 DAY TAT

## 2020-04-10 LAB — NOVEL CORONAVIRUS, NAA: SARS-CoV-2, NAA: NOT DETECTED

## 2020-05-02 IMAGING — DX RIGHT HAND - COMPLETE 3+ VIEW
3 series · 3 of 3 positions shown · non-contrast
Comparison: None.

CLINICAL DATA: Lifting injury with pain.

EXAM:
RIGHT HAND - COMPLETE 3+ VIEW

[hand pa]
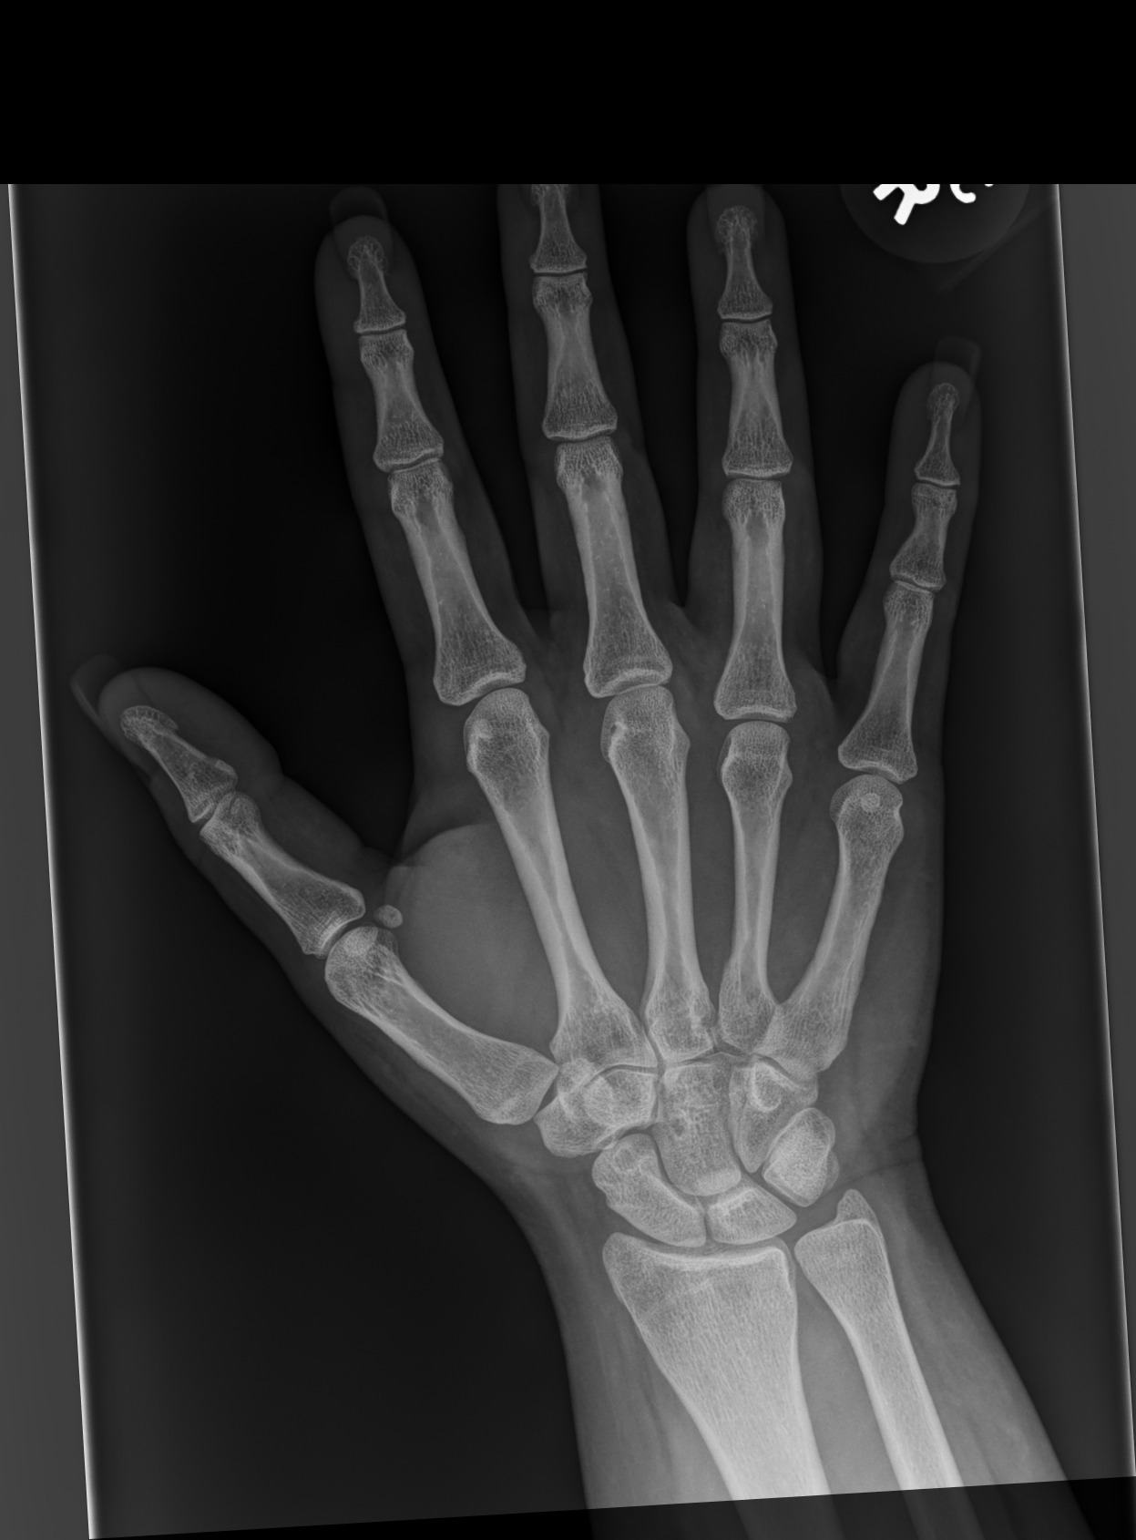

[hand mlo]
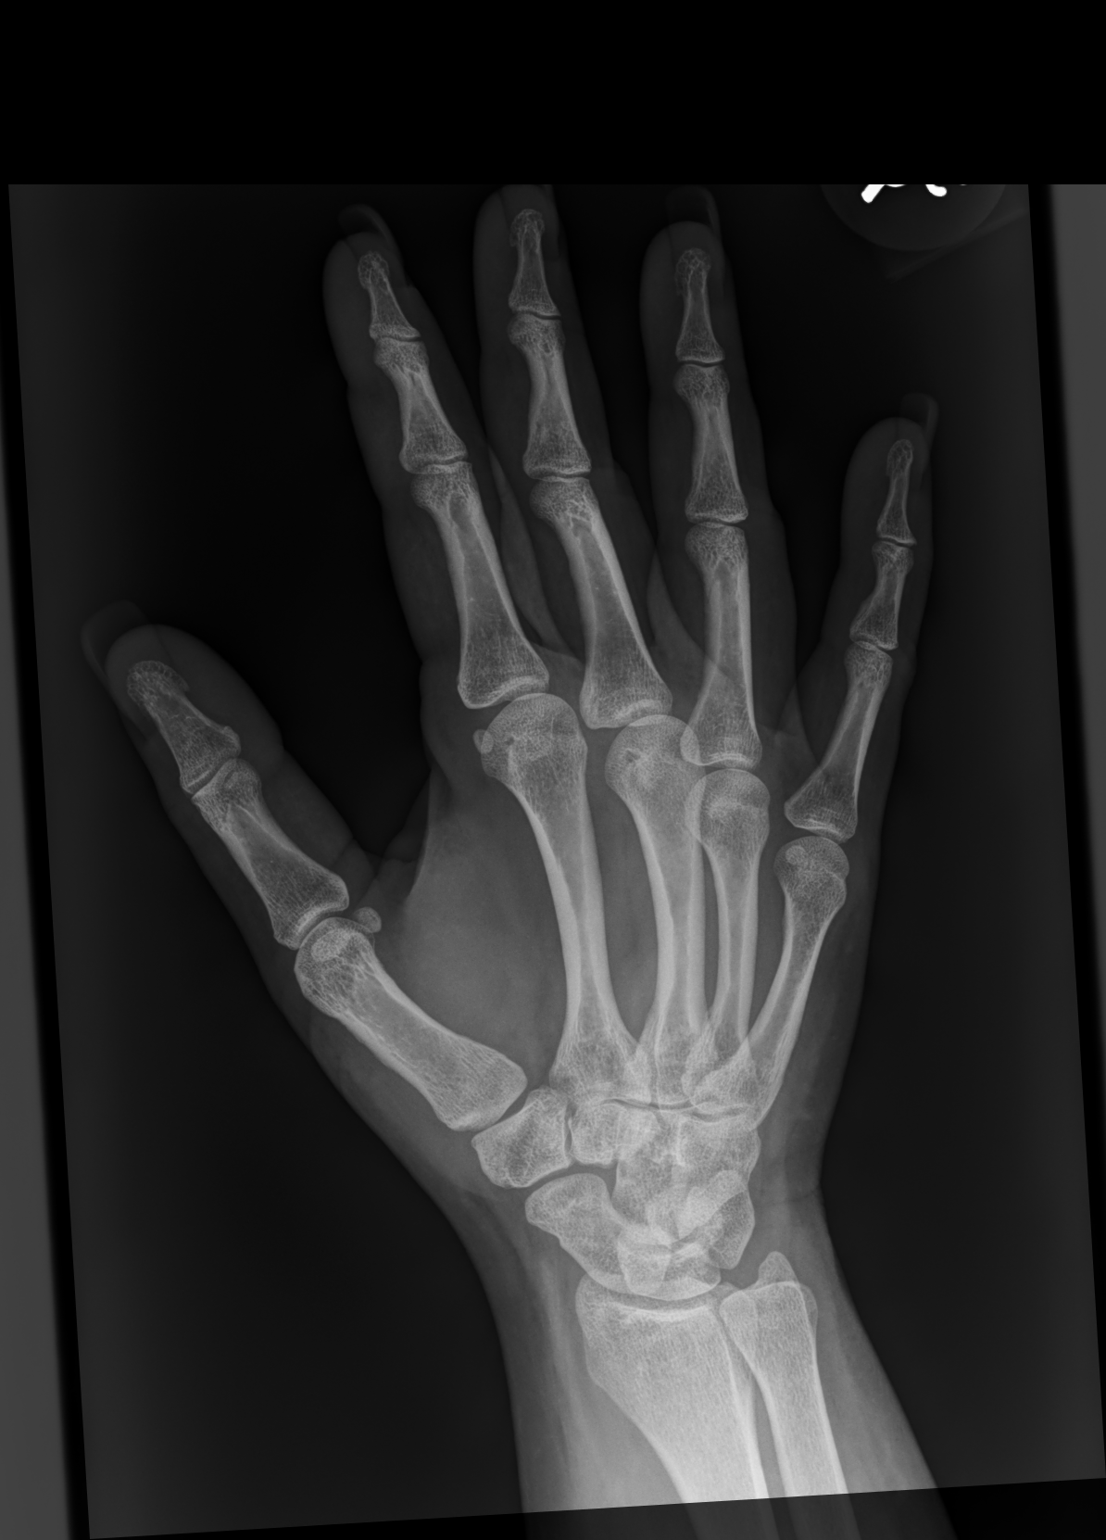

[hand lat]
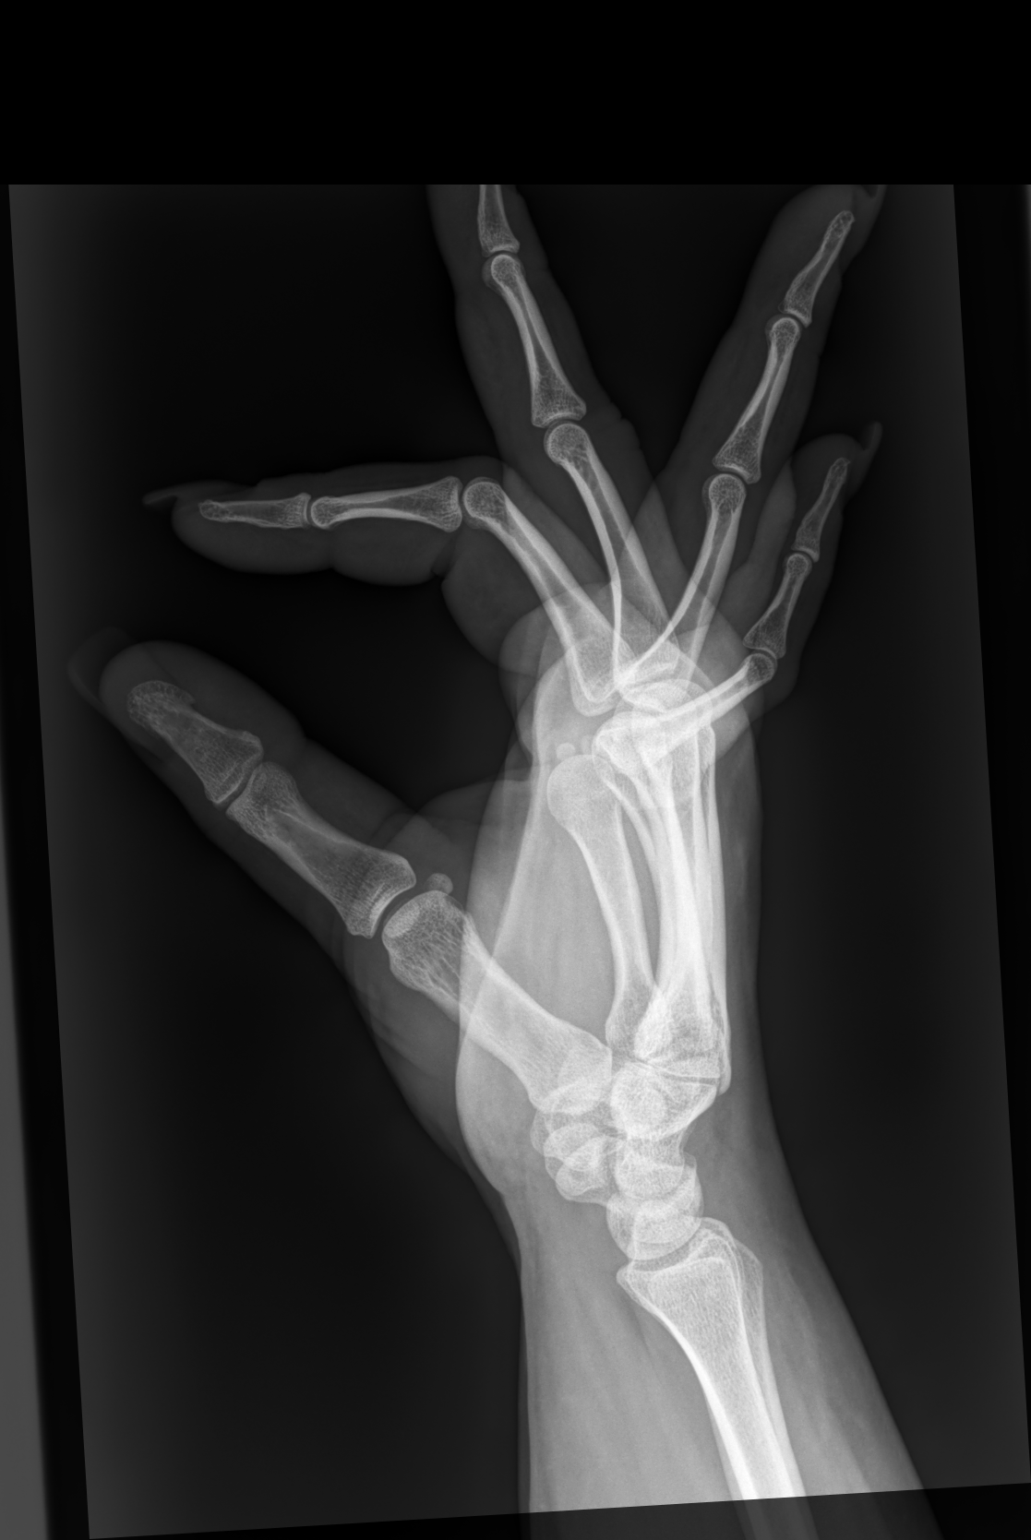

[3 of 3 positions shown; findings below may reference images not displayed]

FINDINGS: There is no evidence of fracture or dislocation. There is no
evidence of arthropathy or other focal bone abnormality. Soft
tissues are unremarkable.
IMPRESSION: Negative.

## 2020-05-24 ENCOUNTER — Other Ambulatory Visit: Payer: Self-pay

## 2020-05-24 ENCOUNTER — Ambulatory Visit
Admission: RE | Admit: 2020-05-24 | Discharge: 2020-05-24 | Disposition: A | Discharge status: Home / Self Care | Payer: BC Managed Care – PPO | Source: Ambulatory Visit | Attending: Family Medicine | Admitting: Family Medicine

## 2020-05-24 VITALS — BP 112/74 | HR 72 | Temp 98.3°F | Resp 18

## 2020-05-24 DIAGNOSIS — H66005 Acute suppurative otitis media without spontaneous rupture of ear drum, recurrent, left ear: Secondary | ICD-10-CM

## 2020-05-24 MED ORDER — AMOXICILLIN-POT CLAVULANATE 875-125 MG PO TABS: 1.0000 | ORAL_TABLET | Freq: Two times a day (BID) | ORAL | 0 refills | Status: AC

## 2020-05-24 NOTE — ED Provider Notes (Signed)
[facility]    CSN: [id number] Arrival date & time: 05/24/20  1835      History   Chief Complaint Chief Complaint  Patient presents with  . Otalgia    Since yesterday    HPI Dawn Savage is a 39 y.o. female.   Patient complains of earache since yesterday.  She was treated here in September for similar problem with Keflex and prednisone.  She denies any dizziness or other congestion.  HPI  History reviewed. No pertinent past medical history.  There are no problems to display for this patient.   Past Surgical History:  Procedure Laterality Date  . KNEE SURGERY      OB History   No obstetric history on file.      Home Medications    Prior to Admission medications   Medication Sig Start Date End Date Taking? Authorizing Provider  predniSONE (DELTASONE) 50 MG tablet Take 1 tablet (50 mg total) by mouth daily with breakfast. 04/09/20   Belinda Fisher, PA-C    Family History History reviewed. No pertinent family history.  Social History Social History   Tobacco Use  . Smoking status: Current Every Day Smoker    Packs/day: 0.50    Types: Cigarettes  . Smokeless tobacco: Never Used  Vaping Use  . Vaping Use: Never used  Substance Use Topics  . Alcohol use: No  . Drug use: No     Allergies   Hydrocodone   Review of Systems Review of Systems  HENT: Positive for ear pain.   All other systems reviewed and are negative.    Physical Exam Triage Vital Signs ED Triage Vitals  Enc Vitals Group     BP 05/24/20 1851 112/74     Pulse Rate 05/24/20 1851 72     Resp 05/24/20 1851 18     Temp 05/24/20 1851 98.3 F (36.8 C)     Temp Source 05/24/20 1851 Oral     SpO2 05/24/20 1851 99 %     Weight --      Height --      Head Circumference --      Peak Flow --      Pain Score 05/24/20 1853 1     Pain Loc --      Pain Edu? --      Excl. in GC? --    No data found.  Updated Vital Signs BP 112/74 (BP Location: Left Arm)   Pulse 72   Temp  98.3 F (36.8 C) (Oral)   Resp 18   LMP 05/13/2020 (Exact Date)   SpO2 99%   Visual Acuity Right Eye Distance:   Left Eye Distance:   Bilateral Distance:    Right Eye Near:   Left Eye Near:    Bilateral Near:     Physical Exam Vitals and nursing note reviewed.  Constitutional:      Appearance: Normal appearance.  HENT:     Head: Normocephalic.     Ears:     Comments: Left TM is inflamed without loss of light reflex    Nose: Nose normal.  Cardiovascular:     Rate and Rhythm: Normal rate and regular rhythm.  Pulmonary:     Effort: Pulmonary effort is normal.     Breath sounds: Normal breath sounds.  Neurological:     General: No focal deficit present.     Mental Status: She is alert and oriented to person, place, and time.  UC Treatments / Results  Labs (all labs ordered are listed, but only abnormal results are displayed) Labs Reviewed - No data to display  EKG   Radiology No results found.  Procedures Procedures (including critical care time)  Medications Ordered in UC Medications - No data to display  Initial Impression / Assessment and Plan / UC Course  I have reviewed the triage vital signs and the nursing notes.  Pertinent labs & imaging results that were available during my care of the patient were reviewed by me and considered in my medical decision making (see chart for details).     Left otitis media Final Clinical Impressions(s) / UC Diagnoses   Final diagnoses:  None   Discharge Instructions   None    ED Prescriptions    None     PDMP not reviewed this encounter.   Frederica Kuster, MD 05/24/20 [phone]

## 2020-05-24 NOTE — ED Triage Notes (Signed)
Patient states that she noticed yesterday that her left ear felt warm and her gland on the left side is swollen and ear is mildly painful. Pt is aox4 and ambulatory. Pt states no drainage from the left ear and right ear has no issues.

## 2020-12-13 DIAGNOSIS — M19071 Primary osteoarthritis, right ankle and foot: Secondary | ICD-10-CM | POA: Diagnosis not present

## 2020-12-13 DIAGNOSIS — M84374A Stress fracture, right foot, initial encounter for fracture: Secondary | ICD-10-CM | POA: Diagnosis not present

## 2020-12-13 DIAGNOSIS — M67371 Transient synovitis, right ankle and foot: Secondary | ICD-10-CM | POA: Diagnosis not present

## 2020-12-25 DIAGNOSIS — M84374D Stress fracture, right foot, subsequent encounter for fracture with routine healing: Secondary | ICD-10-CM | POA: Diagnosis not present

## 2021-01-07 DIAGNOSIS — M84374D Stress fracture, right foot, subsequent encounter for fracture with routine healing: Secondary | ICD-10-CM | POA: Diagnosis not present

## 2021-01-07 DIAGNOSIS — E559 Vitamin D deficiency, unspecified: Secondary | ICD-10-CM | POA: Diagnosis not present

## 2021-01-16 DIAGNOSIS — M84374D Stress fracture, right foot, subsequent encounter for fracture with routine healing: Secondary | ICD-10-CM | POA: Diagnosis not present

## 2021-01-22 DIAGNOSIS — Z8349 Family history of other endocrine, nutritional and metabolic diseases: Secondary | ICD-10-CM | POA: Diagnosis not present

## 2021-01-22 DIAGNOSIS — R5383 Other fatigue: Secondary | ICD-10-CM | POA: Diagnosis not present

## 2021-01-22 DIAGNOSIS — Z0000 Encounter for general adult medical examination without abnormal findings: Secondary | ICD-10-CM | POA: Diagnosis not present

## 2021-01-22 DIAGNOSIS — E559 Vitamin D deficiency, unspecified: Secondary | ICD-10-CM | POA: Diagnosis not present

## 2021-01-23 DIAGNOSIS — M84374D Stress fracture, right foot, subsequent encounter for fracture with routine healing: Secondary | ICD-10-CM | POA: Diagnosis not present

## 2021-02-24 DIAGNOSIS — M84374D Stress fracture, right foot, subsequent encounter for fracture with routine healing: Secondary | ICD-10-CM | POA: Diagnosis not present

## 2021-03-05 DIAGNOSIS — M84374D Stress fracture, right foot, subsequent encounter for fracture with routine healing: Secondary | ICD-10-CM | POA: Diagnosis not present

## 2021-03-19 DIAGNOSIS — M7751 Other enthesopathy of right foot: Secondary | ICD-10-CM | POA: Diagnosis not present

## 2021-03-19 DIAGNOSIS — M21611 Bunion of right foot: Secondary | ICD-10-CM | POA: Diagnosis not present

## 2021-03-19 DIAGNOSIS — R5383 Other fatigue: Secondary | ICD-10-CM | POA: Diagnosis not present

## 2021-03-19 DIAGNOSIS — M79671 Pain in right foot: Secondary | ICD-10-CM | POA: Diagnosis not present

## 2021-03-19 DIAGNOSIS — G5761 Lesion of plantar nerve, right lower limb: Secondary | ICD-10-CM | POA: Diagnosis not present

## 2021-03-19 DIAGNOSIS — E559 Vitamin D deficiency, unspecified: Secondary | ICD-10-CM | POA: Diagnosis not present

## 2021-03-26 DIAGNOSIS — Z8349 Family history of other endocrine, nutritional and metabolic diseases: Secondary | ICD-10-CM | POA: Diagnosis not present

## 2021-03-26 DIAGNOSIS — J302 Other seasonal allergic rhinitis: Secondary | ICD-10-CM | POA: Diagnosis not present

## 2021-03-26 DIAGNOSIS — E559 Vitamin D deficiency, unspecified: Secondary | ICD-10-CM | POA: Diagnosis not present

## 2021-03-26 DIAGNOSIS — J4520 Mild intermittent asthma, uncomplicated: Secondary | ICD-10-CM | POA: Diagnosis not present

## 2021-04-02 DIAGNOSIS — M7751 Other enthesopathy of right foot: Secondary | ICD-10-CM | POA: Diagnosis not present

## 2021-04-02 DIAGNOSIS — G5761 Lesion of plantar nerve, right lower limb: Secondary | ICD-10-CM | POA: Diagnosis not present

## 2021-04-16 DIAGNOSIS — G5761 Lesion of plantar nerve, right lower limb: Secondary | ICD-10-CM | POA: Diagnosis not present

## 2021-04-16 DIAGNOSIS — M7751 Other enthesopathy of right foot: Secondary | ICD-10-CM | POA: Diagnosis not present

## 2021-04-18 DIAGNOSIS — M25462 Effusion, left knee: Secondary | ICD-10-CM | POA: Diagnosis not present

## 2021-04-30 DIAGNOSIS — M7751 Other enthesopathy of right foot: Secondary | ICD-10-CM | POA: Diagnosis not present

## 2021-04-30 DIAGNOSIS — G5761 Lesion of plantar nerve, right lower limb: Secondary | ICD-10-CM | POA: Diagnosis not present

## 2021-05-15 DIAGNOSIS — G5761 Lesion of plantar nerve, right lower limb: Secondary | ICD-10-CM | POA: Diagnosis not present

## 2021-05-15 DIAGNOSIS — M7751 Other enthesopathy of right foot: Secondary | ICD-10-CM | POA: Diagnosis not present

## 2021-08-18 DIAGNOSIS — M25562 Pain in left knee: Secondary | ICD-10-CM | POA: Diagnosis not present

## 2021-08-18 DIAGNOSIS — M1712 Unilateral primary osteoarthritis, left knee: Secondary | ICD-10-CM | POA: Diagnosis not present

## 2021-09-29 DIAGNOSIS — M1712 Unilateral primary osteoarthritis, left knee: Secondary | ICD-10-CM | POA: Diagnosis not present

## 2021-10-06 DIAGNOSIS — M1712 Unilateral primary osteoarthritis, left knee: Secondary | ICD-10-CM | POA: Diagnosis not present

## 2021-10-13 DIAGNOSIS — M1712 Unilateral primary osteoarthritis, left knee: Secondary | ICD-10-CM | POA: Diagnosis not present

## 2021-10-14 ENCOUNTER — Ambulatory Visit
Admission: EM | Admit: 2021-10-14 | Discharge: 2021-10-14 | Disposition: A | Discharge status: Home / Self Care | Payer: BC Managed Care – PPO | Attending: Internal Medicine | Admitting: Internal Medicine

## 2021-10-14 ENCOUNTER — Encounter: Payer: Self-pay | Admitting: Emergency Medicine

## 2021-10-14 DIAGNOSIS — R21 Rash and other nonspecific skin eruption: Secondary | ICD-10-CM

## 2021-10-14 DIAGNOSIS — L509 Urticaria, unspecified: Secondary | ICD-10-CM | POA: Diagnosis not present

## 2021-10-14 MED ORDER — HYDROXYZINE HCL 25 MG PO TABS: 25.0000 mg | ORAL_TABLET | Freq: Four times a day (QID) | ORAL | 0 refills | Status: AC | PRN

## 2021-10-14 MED ORDER — DOXYCYCLINE HYCLATE 100 MG PO CAPS: 100.0000 mg | ORAL_CAPSULE | Freq: Two times a day (BID) | ORAL | 0 refills | Status: AC

## 2021-10-14 NOTE — ED Provider Notes (Signed)
?[facility] ? ? ? ?CSN: [id number] ?Arrival date & time: 10/14/21  1946 ? ? ?  ? ?History   ?Chief Complaint ?Chief Complaint  ?Patient presents with  ? Insect Bite  ? ? ?HPI ?Dawn Savage is a 41 y.o. female.  ? ?Patient presents for onset back to posterior right knee.  Patient not sure if anything bit her but noticed that she had a raised lesion with associated itchiness.  Denies any changes to lotions, soaps, detergents, foods, etc.  Denies any obvious insect bites but states that she has been working outside mowing lawns a lot lately.  Denies any numbness or tingling.  Patient has full range of motion of knee.  Denies fever, body aches, chills. ? ? ? ?History reviewed. No pertinent past medical history. ? ?There are no problems to display for this patient. ? ? ?Past Surgical History:  ?Procedure Laterality Date  ? KNEE SURGERY    ? ? ?OB History   ?No obstetric history on file. ?  ? ? ? ?Home Medications   ? ?Prior to Admission medications   ?Medication Sig Start Date End Date Taking? Authorizing Provider  ?doxycycline (VIBRAMYCIN) 100 MG capsule Take 1 capsule (100 mg total) by mouth 2 (two) times daily. 10/14/21  Yes Gustavus Bryant, FNP  ?hydrOXYzine (ATARAX) 25 MG tablet Take 1 tablet (25 mg total) by mouth every 6 (six) hours as needed. 10/14/21  Yes Ervin Knack E, FNP  ?amoxicillin-clavulanate (AUGMENTIN) 875-125 MG tablet Take 1 tablet by mouth every 12 (twelve) hours. 05/24/20   Frederica Kuster, MD  ?predniSONE (DELTASONE) 50 MG tablet Take 1 tablet (50 mg total) by mouth daily with breakfast. 04/09/20   Belinda Fisher, PA-C  ? ? ?Family History ?History reviewed. No pertinent family history. ? ?Social History ?Social History  ? ?Tobacco Use  ? Smoking status: Every Day  ?  Packs/day: 0.50  ?  Types: Cigarettes  ? Smokeless tobacco: Never  ?Vaping Use  ? Vaping Use: Never used  ?Substance Use Topics  ? Alcohol use: No  ? Drug use: No  ? ? ? ?Allergies   ?Hydrocodone ? ? ?Review of Systems ?Review of  Systems ?Per HPI ? ?Physical Exam ?Triage Vital Signs ?ED Triage Vitals  ?Enc Vitals Group  ?   BP 10/14/21 1950 118/79  ?   Pulse Rate 10/14/21 1950 93  ?   Resp 10/14/21 1950 16  ?   Temp 10/14/21 1950 98.6 ?F (37 ?C)  ?   Temp Source 10/14/21 1950 Oral  ?   SpO2 10/14/21 1950 96 %  ?   Weight --   ?   Height --   ?   Head Circumference --   ?   Peak Flow --   ?   Pain Score 10/14/21 1953 0  ?   Pain Loc --   ?   Pain Edu? --   ?   Excl. in GC? --   ? ?No data found. ? ?Updated Vital Signs ?BP 118/79 (BP Location: Left Arm)   Pulse 93   Temp 98.6 ?F (37 ?C) (Oral)   Resp 16   SpO2 96%  ? ?Visual Acuity ?Right Eye Distance:   ?Left Eye Distance:   ?Bilateral Distance:   ? ?Right Eye Near:   ?Left Eye Near:    ?Bilateral Near:    ? ?Physical Exam ?Constitutional:   ?   General: She is not in acute distress. ?   Appearance:  Normal appearance. She is not toxic-appearing or diaphoretic.  ?HENT:  ?   Head: Normocephalic and atraumatic.  ?Eyes:  ?   Extraocular Movements: Extraocular movements intact.  ?   Conjunctiva/sclera: Conjunctivae normal.  ?Pulmonary:  ?   Effort: Pulmonary effort is normal.  ?Skin: ?   Comments: Small slightly raised erythematous approximately 0.5 cm in diameter lesion with associated redness present to right posterior knee.  No drainage noted.  No induration or fluctuance noted.  ?Neurological:  ?   General: No focal deficit present.  ?   Mental Status: She is alert and oriented to person, place, and time. Mental status is at baseline.  ?Psychiatric:     ?   Mood and Affect: Mood normal.     ?   Behavior: Behavior normal.     ?   Thought Content: Thought content normal.     ?   Judgment: Judgment normal.  ? ? ? ?UC Treatments / Results  ?Labs ?(all labs ordered are listed, but only abnormal results are displayed) ?Labs Reviewed - No data to display ? ?EKG ? ? ?Radiology ?No results found. ? ?Procedures ?Procedures (including critical care time) ? ?Medications Ordered in UC ?Medications - No  data to display ? ?Initial Impression / Assessment and Plan / UC Course  ?I have reviewed the triage vital signs and the nursing notes. ? ?Pertinent labs & imaging results that were available during my care of the patient were reviewed by me and considered in my medical decision making (see chart for details). ? ?  ? ?Differential diagnoses include allergic contact dermatitis versus insect bite versus spider bite vs. abscess.  No obvious necrosis noted.  Surrounding erythema most likely due to patient scratching.  Prescribed doxycycline to cover for infection although no obvious bacterial infection present.  Patient denies chance of pregnancy so this medication should be safe. No I&D indicated for any abscesses present.  No obvious tick bites or ticks noted that need to be removed.  Discussed cool compresses. Hydroxyzine prescribed to take as needed for itching.  Advised medication can cause drowsiness.  Patient advised to follow-up if symptoms persist or worsen.  Also discussed strict ER precautions.  Patient verbalized understanding and was agreeable with plan. ?Final Clinical Impressions(s) / UC Diagnoses  ? ?Final diagnoses:  ?Rash and nonspecific skin eruption  ?Urticaria  ? ? ? ?Discharge Instructions   ? ?  ?You have been prescribed an antibiotic as well as a medication to take as needed for itching and allergic reaction.  Please follow-up if symptoms persist or worsen. ? ? ? ?ED Prescriptions   ? ? Medication Sig Dispense Auth. Provider  ? doxycycline (VIBRAMYCIN) 100 MG capsule Take 1 capsule (100 mg total) by mouth 2 (two) times daily. 20 capsule Gustavus Bryant, [place]  ? hydrOXYzine (ATARAX) 25 MG tablet Take 1 tablet (25 mg total) by mouth every 6 (six) hours as needed. 12 tablet Ervin Knack E, [place]  ? ?  ? ?PDMP not reviewed this encounter. ?  ?Gustavus Bryant, [place] ?10/14/21 2003 ? ?

## 2021-10-14 NOTE — Discharge Instructions (Signed)
You have been prescribed an antibiotic as well as a medication to take as needed for itching and allergic reaction.  Please follow-up if symptoms persist or worsen. ?

## 2021-10-14 NOTE — ED Triage Notes (Signed)
Pt said on Saturday noticed a place on the back of her right knee that had a black spot and then began to itch really bad Sunday and noticed a red streak on the inner part of knee area. No pain just itchy ?

## 2021-12-01 DIAGNOSIS — M1712 Unilateral primary osteoarthritis, left knee: Secondary | ICD-10-CM | POA: Diagnosis not present

## 2022-03-23 DIAGNOSIS — Z8349 Family history of other endocrine, nutritional and metabolic diseases: Secondary | ICD-10-CM | POA: Diagnosis not present

## 2022-03-23 DIAGNOSIS — Z0000 Encounter for general adult medical examination without abnormal findings: Secondary | ICD-10-CM | POA: Diagnosis not present

## 2022-03-23 DIAGNOSIS — M1712 Unilateral primary osteoarthritis, left knee: Secondary | ICD-10-CM | POA: Diagnosis not present

## 2022-03-23 DIAGNOSIS — M170 Bilateral primary osteoarthritis of knee: Secondary | ICD-10-CM | POA: Diagnosis not present

## 2022-03-23 DIAGNOSIS — E559 Vitamin D deficiency, unspecified: Secondary | ICD-10-CM | POA: Diagnosis not present

## 2022-03-23 DIAGNOSIS — Z13220 Encounter for screening for lipoid disorders: Secondary | ICD-10-CM | POA: Diagnosis not present

## 2022-04-07 DIAGNOSIS — R7301 Impaired fasting glucose: Secondary | ICD-10-CM | POA: Diagnosis not present

## 2022-04-07 DIAGNOSIS — J4520 Mild intermittent asthma, uncomplicated: Secondary | ICD-10-CM | POA: Diagnosis not present

## 2022-04-07 DIAGNOSIS — Z0000 Encounter for general adult medical examination without abnormal findings: Secondary | ICD-10-CM | POA: Diagnosis not present

## 2022-04-07 DIAGNOSIS — E559 Vitamin D deficiency, unspecified: Secondary | ICD-10-CM | POA: Diagnosis not present

## 2022-04-27 DIAGNOSIS — H6502 Acute serous otitis media, left ear: Secondary | ICD-10-CM | POA: Diagnosis not present

## 2022-05-14 DIAGNOSIS — M1712 Unilateral primary osteoarthritis, left knee: Secondary | ICD-10-CM | POA: Diagnosis not present

## 2022-05-21 DIAGNOSIS — M25562 Pain in left knee: Secondary | ICD-10-CM | POA: Diagnosis not present

## 2022-05-21 DIAGNOSIS — M1712 Unilateral primary osteoarthritis, left knee: Secondary | ICD-10-CM | POA: Diagnosis not present

## 2022-05-28 DIAGNOSIS — M1712 Unilateral primary osteoarthritis, left knee: Secondary | ICD-10-CM | POA: Diagnosis not present
